# Patient Record
Sex: Female | Born: 1940 | Race: Black or African American | Hispanic: No
Health system: Southern US, Community
[De-identification: ages and names within clinical notes are randomized; demographics above are authoritative.]

---

## 2021-07-19 ENCOUNTER — Emergency Department: Payer: Medicare HMO

## 2021-07-19 ENCOUNTER — Emergency Department
Admission: EM | Admit: 2021-07-19 | Discharge: 2021-07-19 | Disposition: A | Discharge status: Home / Self Care | Payer: Medicare HMO | Attending: Emergency Medicine | Admitting: Emergency Medicine

## 2021-07-19 ENCOUNTER — Other Ambulatory Visit: Payer: Self-pay

## 2021-07-19 DIAGNOSIS — M25511 Pain in right shoulder: Secondary | ICD-10-CM | POA: Diagnosis present

## 2021-07-19 DIAGNOSIS — M79621 Pain in right upper arm: Secondary | ICD-10-CM

## 2021-07-19 MED ORDER — TRAMADOL HCL 50 MG PO TABS: 50.0000 mg | ORAL_TABLET | Freq: Four times a day (QID) | ORAL | 0 refills | Status: AC | PRN

## 2021-07-19 MED ORDER — TRAMADOL HCL 50 MG PO TABS
50.0000 mg | ORAL_TABLET | Freq: Once | ORAL | Status: AC
  Administered 2021-07-19: 50 mg via ORAL
  Filled 2021-07-19: qty 1

## 2021-07-19 NOTE — ED Notes (Signed)
See triage note  Presents with 2-3 day hx of right shoulder pain  Denies any injury  No deformity noted  States she has been using OTC meds w/o relief   States pain is also at neck at times

## 2021-07-19 NOTE — ED Triage Notes (Signed)
Pt presents to the ED with c/o right shoulder pain that began 2 days ago without trauma or injury. Pt states that she has been trying OTC medications without relief.

## 2021-07-19 NOTE — ED Provider Notes (Signed)
[facility] Emergency Department Provider Note  Time seen: 6:55 PM  I have reviewed the triage vital signs and the nursing notes.   HISTORY  Chief Complaint Shoulder Pain   HPI Cynthia Flynn is a 80 y.o. female presents to the emergency department for right shoulder pain.  According to the patient and son for the past 2 to 3 days she has been complaining of right shoulder pain.  States that is worse with movement.  Denies any falls or trauma.  No other complaints.  No history of shoulder pain previously.   No past medical history on file.  There are no problems to display for this patient.   Prior to Admission medications   Not on File    No Known Allergies  No family history on file.  Social History    Review of Systems Constitutional: Negative for fever. Cardiovascular: Negative for chest pain. Respiratory: Negative for shortness of breath. Gastrointestinal: Negative for abdominal pain, vomiting  Musculoskeletal: Right shoulder pain Neurological: Negative for headache All other ROS negative  ____________________________________________   PHYSICAL EXAM:  VITAL SIGNS: ED Triage Vitals  Enc Vitals Group     BP 07/19/21 1730 139/85     Pulse Rate 07/19/21 1730 82     Resp 07/19/21 1730 18     Temp 07/19/21 1730 98.6 F (37 C)     Temp Source 07/19/21 1730 Oral     SpO2 07/19/21 1730 98 %     Weight 07/19/21 1730 185 lb (83.9 kg)     Height 07/19/21 1730 5\' 6"  (1.676 m)     Head Circumference --      Peak Flow --      Pain Score --      Pain Loc --      Pain Edu? --      Excl. in GC? --    Constitutional: Alert and oriented. Well appearing and in no distress. Eyes: Normal exam ENT      Head: Normocephalic and atraumatic.      Mouth/Throat: Mucous membranes are moist. Cardiovascular: Normal rate, regular rhythm.  Respiratory: Normal respiratory effort without tachypnea nor retractions. Breath sounds are  clear Gastrointestinal: Soft and nontender. No distention.  Musculoskeletal: Mild tenderness palpation of the right shoulder.  Good range of motion.  Does state some pain with lateral abduction.  Neurovascular intact distally. Neurologic:  Normal speech and language. No gross focal neurologic deficits Skin:  Skin is warm, dry and intact.  Psychiatric: Mood and affect are normal.   ____________________________________________     RADIOLOGY   IMPRESSION:  No acute fracture or dislocation.   ____________________________________________   INITIAL IMPRESSION / ASSESSMENT AND PLAN / ED COURSE  Pertinent labs & imaging results that were available during my care of the patient were reviewed by me and considered in my medical decision making (see chart for details).   Patient presents emergency department for right shoulder pain.  Patient does have mild tenderness palpation of the right shoulder with good range of motion.  Good grip strength neuro vastly intact distally.  Given the reassuring x-ray highly suspect muscular pain versus deltoid strain.  Discussed with the patient trial of tramadol as needed for pain relief with orthopedic follow-up if not improved over the next 5 to 6 days.  Son agreeable to plan of care.  Patient will be discharged home with orthopedic follow-up.  Cynthia Flynn was evaluated in Emergency Department on 07/19/2021 for the symptoms described in the  history of present illness. She was evaluated in the context of the global COVID-19 pandemic, which necessitated consideration that the patient might be at risk for infection with the SARS-CoV-2 virus that causes COVID-19. Institutional protocols and algorithms that pertain to the evaluation of patients at risk for COVID-19 are in a state of rapid change based on information released by regulatory bodies including the CDC and federal and state organizations. These policies and algorithms were followed during the patient's care  in the ED.  ____________________________________________   FINAL CLINICAL IMPRESSION(S) / ED DIAGNOSES  Right shoulder pain   Minna Antis, MD 07/19/21 1858

## 2021-12-08 IMAGING — CR DG SHOULDER 2+V*R*
1 series · 3 of 3 positions shown · non-contrast
Comparison: None.

CLINICAL DATA: Right shoulder pain, no history of trauma or injury
peer

EXAM:
RIGHT SHOULDER - 2+ VIEW

[Series 1: dg shoulder 1v right · 0.14mm/px · 3 of 3 slices shown]
[im 1/3]
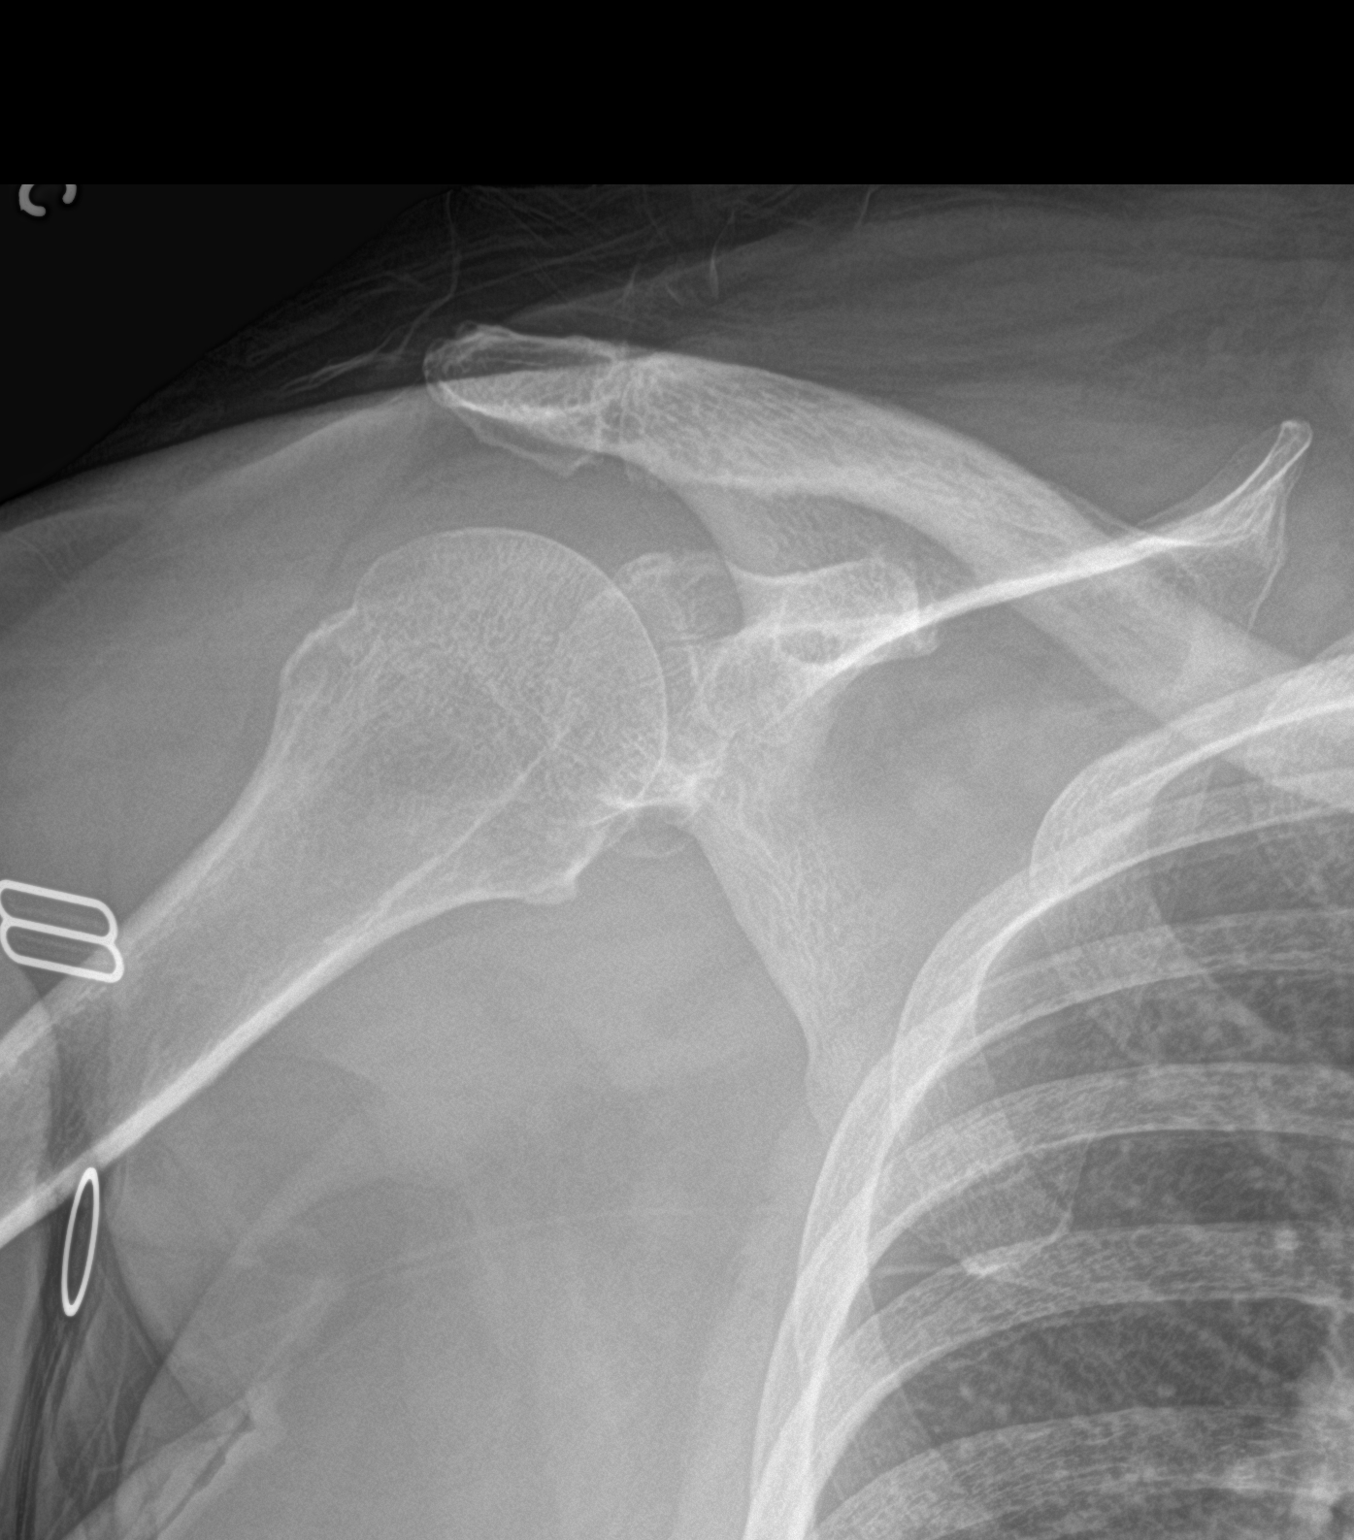
[im 2/3]
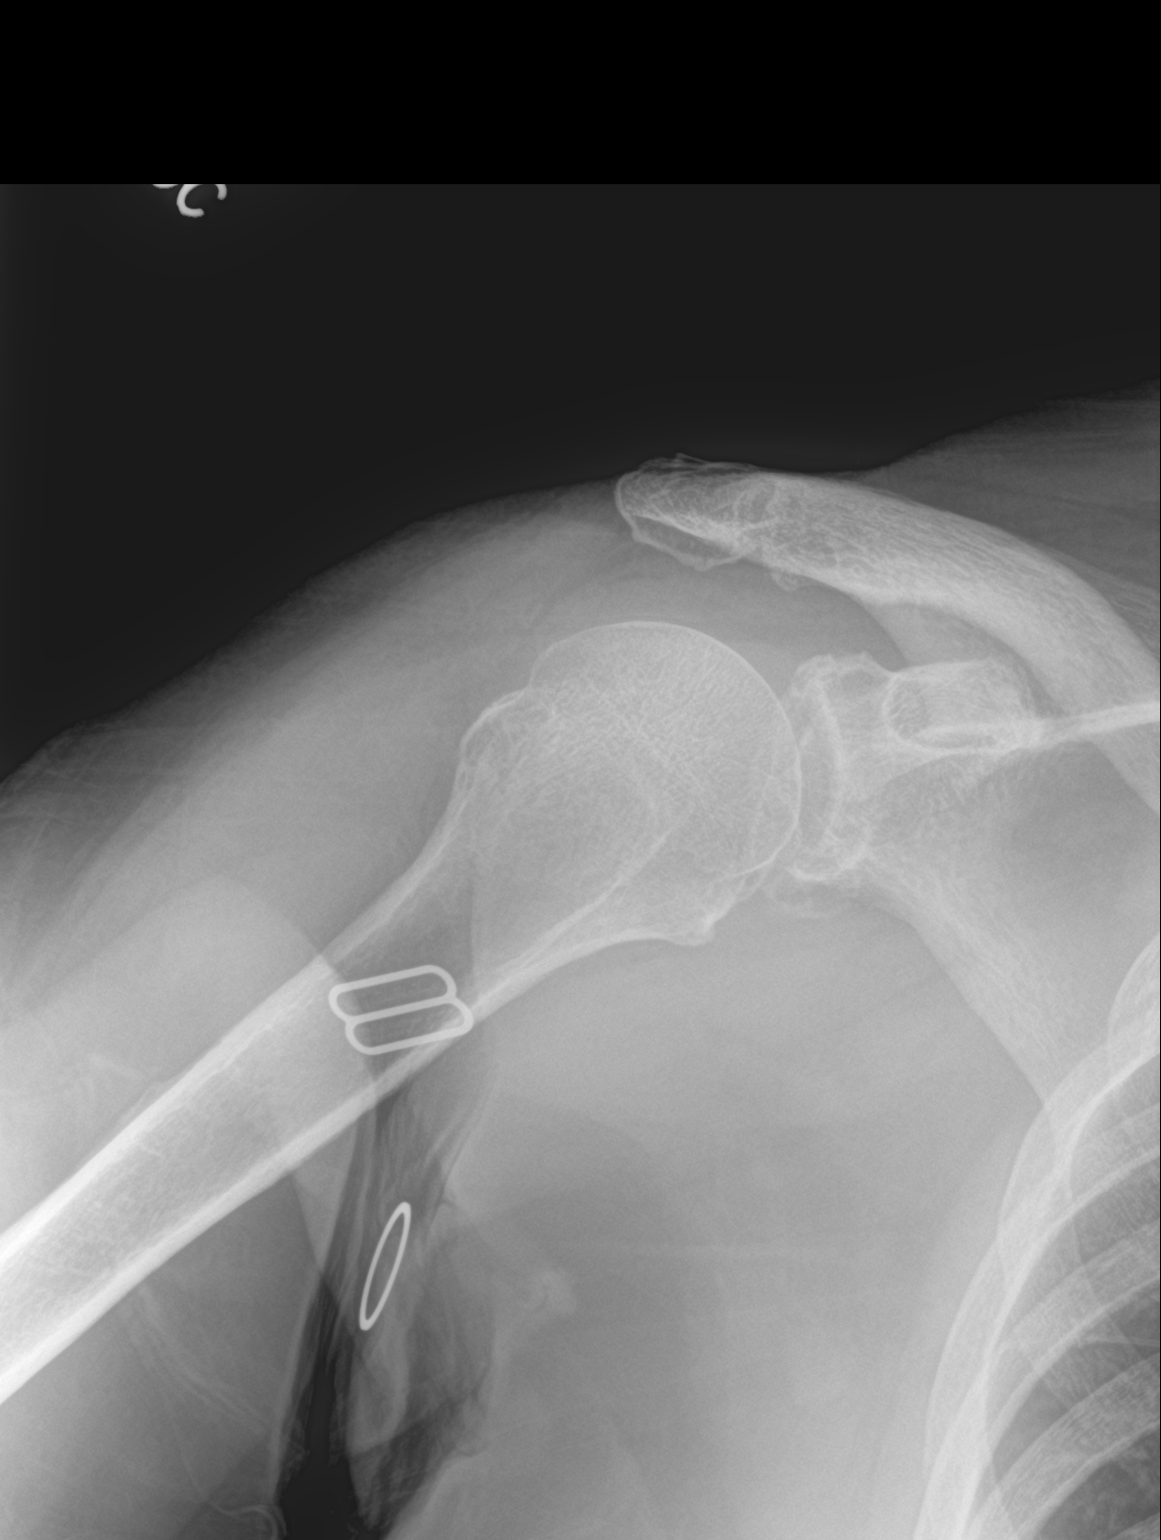
[im 3/3]
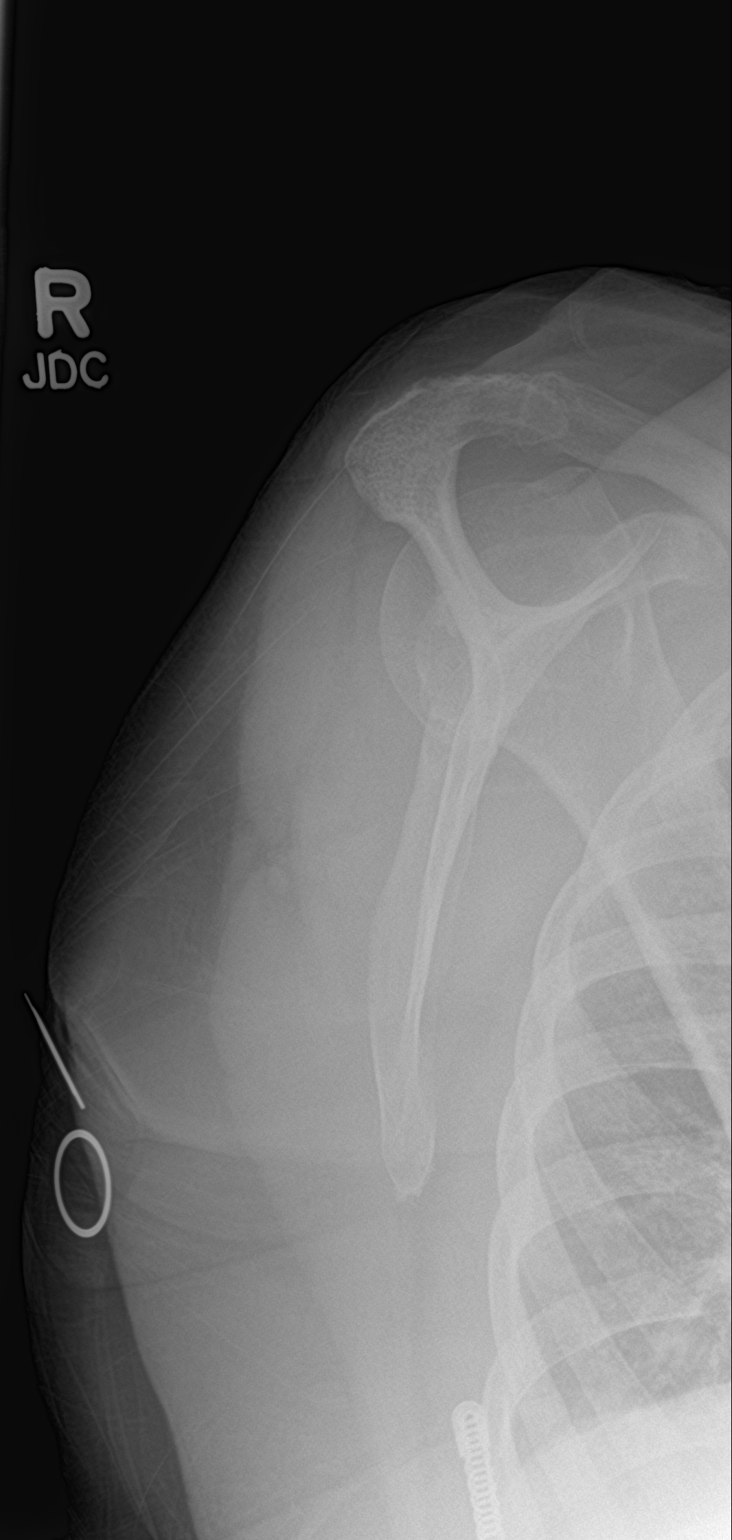

[3 of 3 positions shown; findings below may reference images not displayed]

FINDINGS: Normal alignment. No fracture or dislocation. At least mild
glenohumeral degenerative arthritis with osteophyte formation noted.
Acromioclavicular joint space is preserved. Limited evaluation of
the right hemithorax is unremarkable.
IMPRESSION: No acute fracture or dislocation.
